# Patient Record
Sex: Male | Born: 1995 | Race: White | Hispanic: No | Marital: Single | Smoking: Current some day smoker
Health system: Southern US, Community
[De-identification: ages and names within clinical notes are randomized; demographics above are authoritative.]

## PROBLEM LIST (undated history)

## (undated) DIAGNOSIS — F909 Attention-deficit hyperactivity disorder, unspecified type: Secondary | ICD-10-CM

## (undated) DIAGNOSIS — IMO0002 Reserved for concepts with insufficient information to code with codable children: Principal | ICD-10-CM

## (undated) HISTORY — DX: Reserved for concepts with insufficient information to code with codable children: IMO0002

---
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
## 2016-01-03 ENCOUNTER — Ambulatory Visit (HOSPITAL_COMMUNITY): Payer: Self-pay | Admitting: Psychiatry

## 2016-01-03 ENCOUNTER — Encounter (HOSPITAL_COMMUNITY): Payer: Self-pay | Admitting: *Deleted

## 2016-02-07 ENCOUNTER — Encounter: Payer: Self-pay | Admitting: Pediatrics

## 2017-03-30 ENCOUNTER — Emergency Department (HOSPITAL_COMMUNITY)
Admission: EM | Admit: 2017-03-30 | Discharge: 2017-03-31 | Disposition: A | Discharge status: Home / Self Care | Payer: Self-pay | Attending: Emergency Medicine | Admitting: Emergency Medicine

## 2017-03-30 ENCOUNTER — Encounter (HOSPITAL_COMMUNITY): Payer: Self-pay | Admitting: Emergency Medicine

## 2017-03-30 DIAGNOSIS — K625 Hemorrhage of anus and rectum: Secondary | ICD-10-CM | POA: Insufficient documentation

## 2017-03-30 DIAGNOSIS — F17200 Nicotine dependence, unspecified, uncomplicated: Secondary | ICD-10-CM | POA: Insufficient documentation

## 2017-03-30 DIAGNOSIS — Z79899 Other long term (current) drug therapy: Secondary | ICD-10-CM | POA: Insufficient documentation

## 2017-03-30 DIAGNOSIS — K6289 Other specified diseases of anus and rectum: Secondary | ICD-10-CM | POA: Insufficient documentation

## 2017-03-30 NOTE — ED Provider Notes (Signed)
[facility]-EMERGENCY DEPT Provider Note   CSN: [id number] Arrival date & time: 03/30/17  2250     History   Chief Complaint Chief Complaint  Patient presents with  . Rectal Bleeding    HPI Jordan Sosa is a 21 y.o. male.  Patient presents to the emergency department for evaluation of rectal pain and rectal bleeding. Symptoms ongoing for approximately a month. Patient reports that he has noticed that he has been experiencing decreased number of bowel movements, now only approximately twice a week which is unusual for him. When he does go, however, he notices bright red or dark red blood mixed with the stool. He has been experiencing increasing rectal pain which is now constant.      Past Medical History:  Diagnosis Date  . ADHD (attention deficit hyperactivity disorder)   . Behavior problems 04/18/2013    Patient Active Problem List   Diagnosis Date Noted  . ODD (oppositional defiant disorder) 10/02/2014  . 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 Polysubstance abuse 10/02/2014  . Attention deficit hyperactivity disorder (ADHD), combined type 08/23/2014  . Acne vulgaris 08/23/2014  . Marijuana use 07/20/2013  . High risk sexual behavior 07/20/2013  . Behavior problems 04/18/2013    History reviewed. No pertinent surgical history.     Home Medications    Prior to Admission medications   Medication Sig Start Date End Date Taking? Authorizing Provider  ciprofloxacin (CIPRO) 500 MG tablet Take 1 tablet (500 mg total) by mouth 2 (two) times daily. 03/31/17   Gilda Crease, MD  hydrocortisone (ANUSOL-HC) 25 MG suppository Place 1 suppository (25 mg total) rectally 2 (two) times daily. For 7 days 03/31/17   Gilda Crease, MD  methylphenidate 54 MG PO CR tablet Take 1 tablet (54 mg total) by mouth every morning. 12/05/15   Myrlene Broker, MD  metroNIDAZOLE (FLAGYL) 500 MG tablet Take 1 tablet (500 mg total) by mouth 3  (three) times daily. 03/31/17   Gilda Crease, MD  traMADol (ULTRAM) 50 MG tablet Take 1 tablet (50 mg total) by mouth every 6 (six) hours as needed. 03/31/17   Gilda Crease, MD    Family History History reviewed. No pertinent family history.  Social History Social History  Substance Use Topics  . Smoking status: Current Some Day Smoker  . Smokeless tobacco: Never Used  . Alcohol use No     Comment: On the Weekends.Marland Kitchen4-26-17 per pt no     Allergies   Patient has no known allergies.   Review of Systems Review of Systems  Gastrointestinal: Positive for anal bleeding, blood in stool and rectal pain.  All other systems reviewed and are negative.    Physical Exam Updated Vital Signs BP 126/68 (BP Location: Right Arm)   Pulse 76   Temp 98.3 F (36.8 C) (Oral)   Resp 15   Ht 6\' 2"  (1.88 m)   Wt 70.3 kg (155 lb)   SpO2 99%   BMI 19.90 kg/m   Physical Exam  Constitutional: He is oriented to person, place, and time. He appears well-developed and well-nourished. No distress.  HENT:  Head: Normocephalic and atraumatic.  Right Ear: Hearing normal.  Left Ear: Hearing normal.  Nose: Nose normal.  Mouth/Throat: Oropharynx is clear and moist and mucous membranes are normal.  Eyes: Pupils are equal, round, and reactive to light. Conjunctivae and EOM are normal.  Neck: Normal range of motion. Neck supple.  Cardiovascular: Regular rhythm, S1  normal and S2 normal.  Exam reveals no gallop and no friction rub.   No murmur heard. Pulmonary/Chest: Effort normal and breath sounds normal. No respiratory distress. He exhibits no tenderness.  Abdominal: Soft. Normal appearance and bowel sounds are normal. There is no hepatosplenomegaly. There is no tenderness. There is no rebound, no guarding, no tenderness at McBurney's point and negative Murphy's sign. No hernia.  Genitourinary: Prostate normal. Rectal exam shows tenderness and guaiac positive stool. Rectal exam shows no  external hemorrhoid, no internal hemorrhoid, no fissure, no mass and anal tone normal.  Musculoskeletal: Normal range of motion.  Neurological: He is alert and oriented to person, place, and time. He has normal strength. No cranial nerve deficit or sensory deficit. Coordination normal. GCS eye subscore is 4. GCS verbal subscore is 5. GCS motor subscore is 6.  Skin: Skin is warm, dry and intact. No rash noted. No cyanosis.  Psychiatric: He has a normal mood and affect. His speech is normal and behavior is normal. Thought content normal.  Nursing note and vitals reviewed.    ED Treatments / Results  Labs (all labs ordered are listed, but only abnormal results are displayed) Labs Reviewed  COMPREHENSIVE METABOLIC PANEL - Abnormal; Notable for the following:       Result Value   ALT 10 (*)    All other components within normal limits  CBC - Abnormal; Notable for the following:    WBC 13.7 (*)    All other components within normal limits  POC OCCULT BLOOD, ED  TYPE AND SCREEN    EKG  EKG Interpretation None       Radiology Ct Abdomen Pelvis W Contrast  Result Date: 03/31/2017 CLINICAL DATA:  Intermittent blood in stools, irregular bowel movements, rectal pain for 2 months. EXAM: CT ABDOMEN AND PELVIS WITH CONTRAST TECHNIQUE: Multidetector CT imaging of the abdomen and pelvis was performed using the standard protocol following bolus administration of intravenous contrast. CONTRAST:  ISOVUE-300 IOPAMIDOL (ISOVUE-300) INJECTION 61% COMPARISON:  CT abdomen and pelvis June 27, 2014 FINDINGS: LOWER CHEST: Lung bases are clear. Included heart size is normal. No pericardial effusion. HEPATOBILIARY: Liver and gallbladder are normal. PANCREAS: Normal. SPLEEN: Normal. ADRENALS/URINARY TRACT: Kidneys are orthotopic, demonstrating symmetric enhancement. No nephrolithiasis, hydronephrosis or solid renal masses. The unopacified ureters are normal in course and caliber. Urinary bladder is  partially distended and unremarkable. Normal adrenal glands. STOMACH/BOWEL: The stomach, small and large bowel are normal in course and caliber without inflammatory changes, sensitivity decreased without oral contrast. Mild amount of retained large bowel stool. Normal appendix. VASCULAR/LYMPHATIC: Aortoiliac vessels are normal in course and caliber. No lymphadenopathy by CT size criteria. REPRODUCTIVE: Normal. OTHER: No intraperitoneal free fluid or free air. MUSCULOSKELETAL: Nonacute. IMPRESSION: Negative contrast-enhanced CT abdomen and pelvis. Electronically Signed   By: Awilda Metro M.D.   On: 03/31/2017 01:18    Procedures Procedures (including critical care time)  Medications Ordered in ED Medications  sodium chloride 0.9 % bolus 1,000 mL (1,000 mLs Intravenous New Bag/Given 03/31/17 0054)  iopamidol (ISOVUE-300) 61 % injection 100 mL (100 mLs Intravenous Contrast Given 03/31/17 0104)     Initial Impression / Assessment and Plan / ED Course  I have reviewed the triage vital signs and the nursing notes.  Pertinent labs & imaging results that were available during my care of the patient were reviewed by me and considered in my medical decision making (see chart for details).     Patient presents to the emergency department  for evaluation of rectal bleeding and rectal pain. Symptoms ongoing for approximately a month. Patient reports passage of bright red blood with bowel movements. Patient did have a leukocytosis on blood work. Abdominal exam was largely benign but he did have significant pain on rectal exam. No obvious fissure, hemorrhoid, abscess noted on exam. CT performed to evaluate for deeper infection and inflammation. No acute abnormality noted on CT. Will treat pain, Anusol HC, empiric antibiotics and follow-up with gastroenterology for further evaluation of rectal pain and rectal bleeding.  Final Clinical Impressions(s) / ED Diagnoses   Final diagnoses:  Rectal bleeding    Rectal pain    New Prescriptions New Prescriptions   CIPROFLOXACIN (CIPRO) 500 MG TABLET    Take 1 tablet (500 mg total) by mouth 2 (two) times daily.   HYDROCORTISONE (ANUSOL-HC) 25 MG SUPPOSITORY    Place 1 suppository (25 mg total) rectally 2 (two) times daily. For 7 days   METRONIDAZOLE (FLAGYL) 500 MG TABLET    Take 1 tablet (500 mg total) by mouth 3 (three) times daily.   TRAMADOL (ULTRAM) 50 MG TABLET    Take 1 tablet (50 mg total) by mouth every 6 (six) hours as needed.     Gilda Crease, MD 03/31/17 0140

## 2017-03-30 NOTE — ED Triage Notes (Signed)
Pt states over last 2 months he has been having blood in his stools. Pt states his bowel movements are not regular. Pt also c/o rectal pain.

## 2017-03-31 ENCOUNTER — Emergency Department (HOSPITAL_COMMUNITY): Payer: Self-pay

## 2017-03-31 LAB — TYPE AND SCREEN
ABO/RH(D): O NEG
ANTIBODY SCREEN: NEGATIVE

## 2017-03-31 LAB — CBC
HCT: 43.4 % (ref 39.0–52.0)
Hemoglobin: 15.1 g/dL (ref 13.0–17.0)
MCH: 31.9 pg (ref 26.0–34.0)
MCHC: 34.8 g/dL (ref 30.0–36.0)
MCV: 91.8 fL (ref 78.0–100.0)
PLATELETS: 229 10*3/uL (ref 150–400)
RBC: 4.73 MIL/uL (ref 4.22–5.81)
RDW: 12.8 % (ref 11.5–15.5)
WBC: 13.7 10*3/uL — ABNORMAL HIGH (ref 4.0–10.5)

## 2017-03-31 LAB — COMPREHENSIVE METABOLIC PANEL
ALK PHOS: 56 U/L (ref 38–126)
ALT: 10 U/L — AB (ref 17–63)
AST: 18 U/L (ref 15–41)
Albumin: 4.5 g/dL (ref 3.5–5.0)
Anion gap: 7 (ref 5–15)
BILIRUBIN TOTAL: 0.8 mg/dL (ref 0.3–1.2)
BUN: 16 mg/dL (ref 6–20)
CALCIUM: 9.2 mg/dL (ref 8.9–10.3)
CO2: 29 mmol/L (ref 22–32)
CREATININE: 1.15 mg/dL (ref 0.61–1.24)
Chloride: 103 mmol/L (ref 101–111)
GFR calc non Af Amer: >60 mL/min (ref >60)
Glucose, Bld: 76 mg/dL (ref 65–99)
Potassium: 3.8 mmol/L (ref 3.5–5.1)
SODIUM: 139 mmol/L (ref 135–145)
TOTAL PROTEIN: 7.6 g/dL (ref 6.5–8.1)

## 2017-03-31 MED ORDER — TRAMADOL HCL 50 MG PO TABS: 50.0000 mg | ORAL_TABLET | Freq: Four times a day (QID) | ORAL | 0 refills | Status: AC | PRN

## 2017-03-31 MED ORDER — CIPROFLOXACIN HCL 500 MG PO TABS: 500.0000 mg | ORAL_TABLET | Freq: Two times a day (BID) | ORAL | 0 refills | Status: AC

## 2017-03-31 MED ORDER — HYDROCORTISONE ACETATE 25 MG RE SUPP: 25.0000 mg | Freq: Two times a day (BID) | RECTAL | 0 refills | Status: AC

## 2017-03-31 MED ORDER — METRONIDAZOLE 500 MG PO TABS: 500.0000 mg | ORAL_TABLET | Freq: Three times a day (TID) | ORAL | 0 refills | Status: AC

## 2017-03-31 MED ORDER — SODIUM CHLORIDE 0.9 % IV BOLUS (SEPSIS)
1000.0000 mL | Freq: Once | INTRAVENOUS | Status: AC
Start: 2017-03-31 — End: 2017-03-31
  Administered 2017-03-31: 1000 mL via INTRAVENOUS

## 2017-03-31 MED ORDER — IOPAMIDOL (ISOVUE-300) INJECTION 61%
100.0000 mL | Freq: Once | INTRAVENOUS | Status: AC | PRN
  Administered 2017-03-31: 100 mL via INTRAVENOUS

## 2018-06-09 ENCOUNTER — Encounter: Payer: Self-pay | Admitting: Pediatrics

## 2018-11-10 DEATH — deceased

## 2019-06-12 IMAGING — CT CT ABD-PELV W/ CM
2 of 4 series · 16 of 46 positions shown, 18 images · IV contrast (iopamidol)
Comparison: CT abdomen and pelvis June 27, 2014

CLINICAL DATA: Intermittent blood in stools, irregular bowel
movements, rectal pain for 2 months.

EXAM:
CT ABDOMEN AND PELVIS WITH CONTRAST
TECHNIQUE: Multidetector CT imaging of the abdomen and pelvis was performed
using the standard protocol following bolus administration of
intravenous contrast.
CONTRAST:  100mL R0OA2D-W88 IOPAMIDOL (R0OA2D-W88) INJECTION 61%

[Series 2: axial st · axial · 0.71mm/px · z∈[+914,+1364]mm · 13 of 102 slices shown, 15 images]
[im 6/102  soft-tissue]
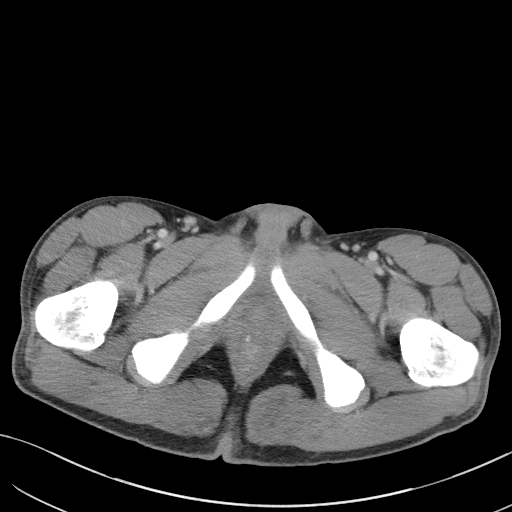
[im 6/102  bone]
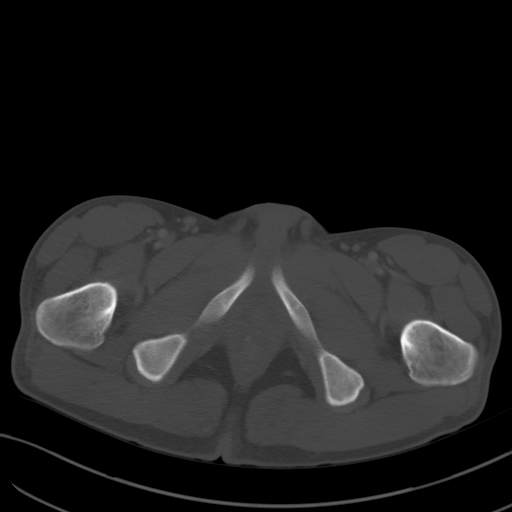
[im 16/102  soft-tissue]
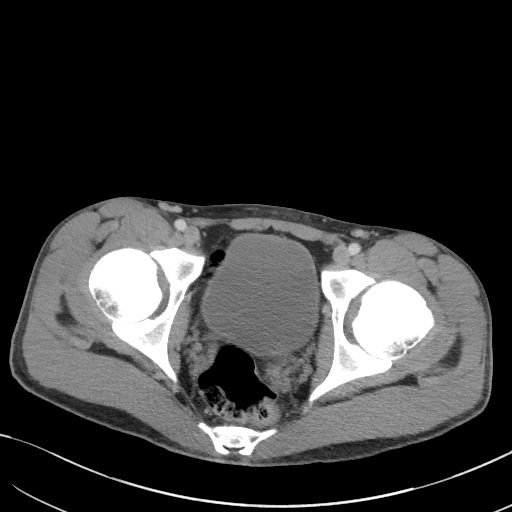
[im 21/102  soft-tissue]
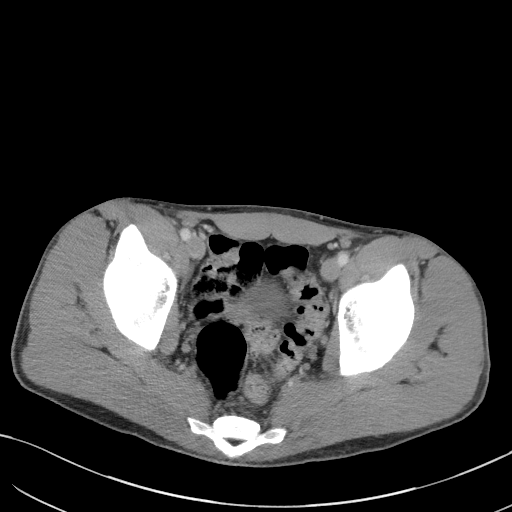
[im 31/102  soft-tissue]
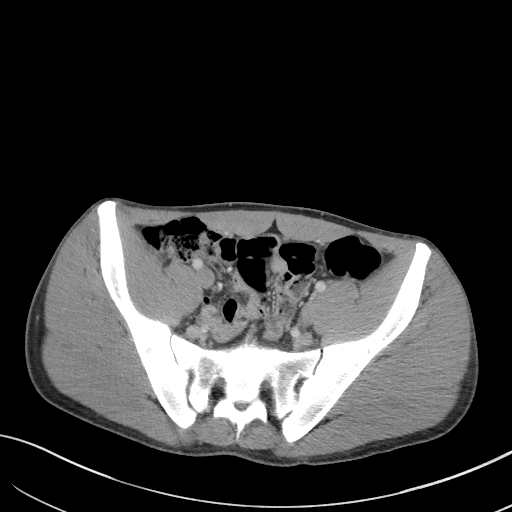
[im 36/102  soft-tissue]
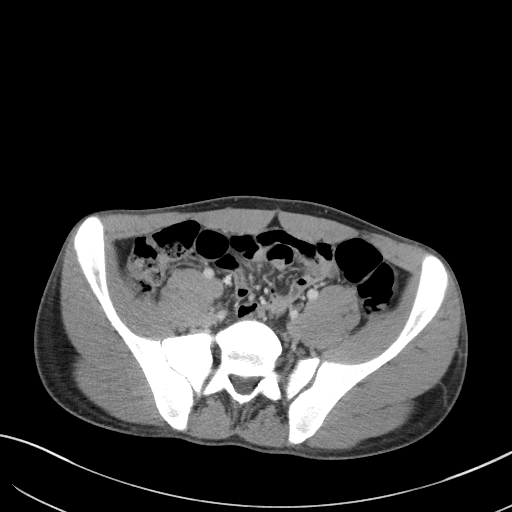
[im 46/102  soft-tissue]
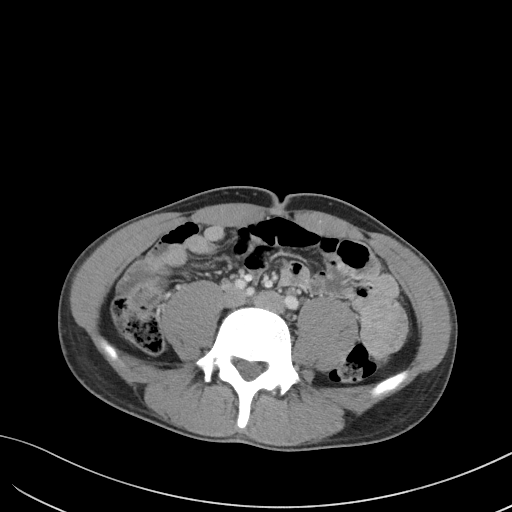
[im 51/102  soft-tissue]
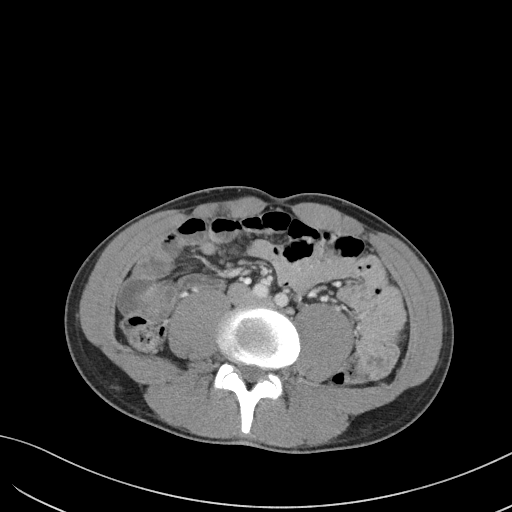
[im 56/102  soft-tissue]
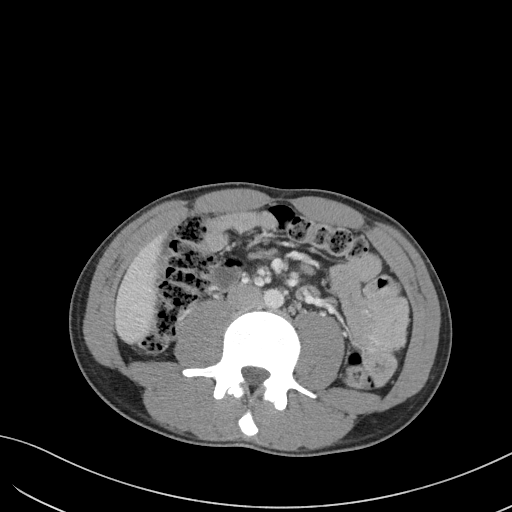
[im 66/102  soft-tissue]
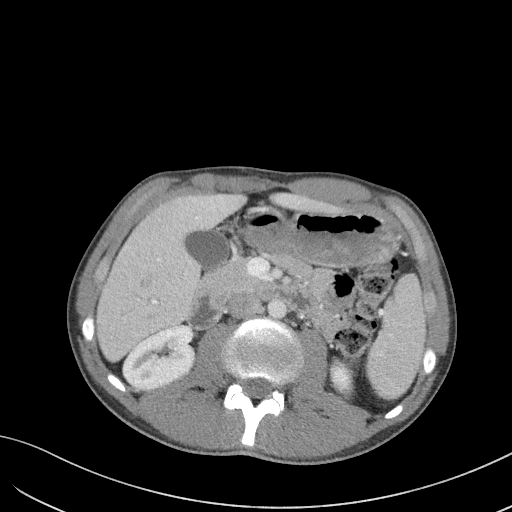
[im 66/102  bone]
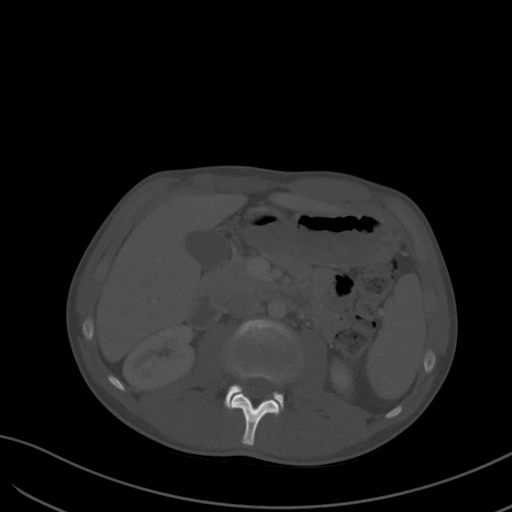
[im 71/102  soft-tissue]
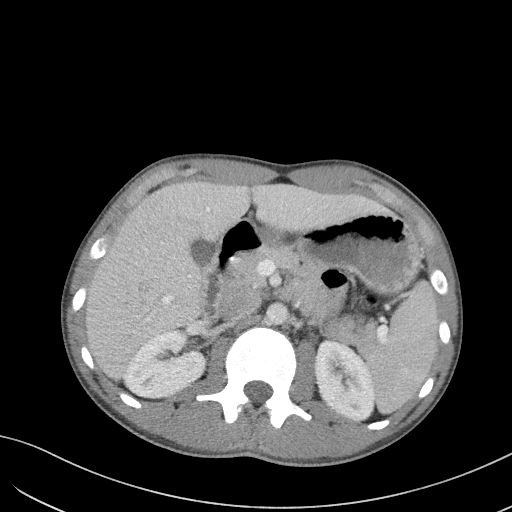
[im 81/102  soft-tissue]
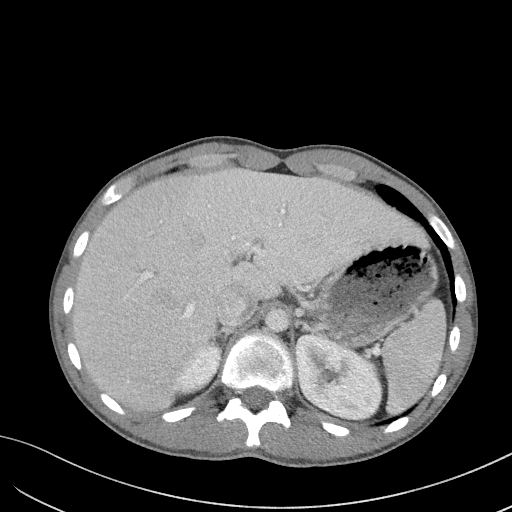
[im 86/102  soft-tissue]
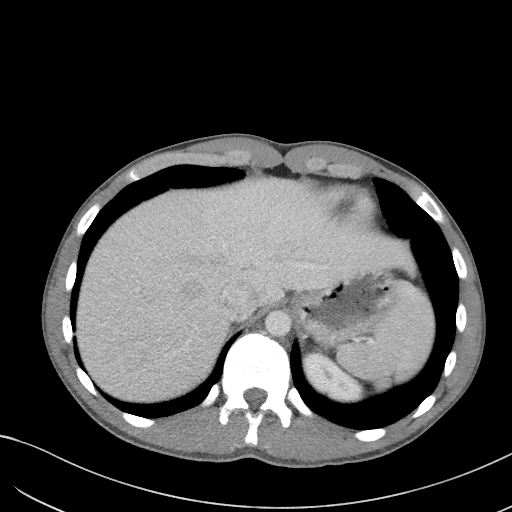
[im 96/102  soft-tissue]
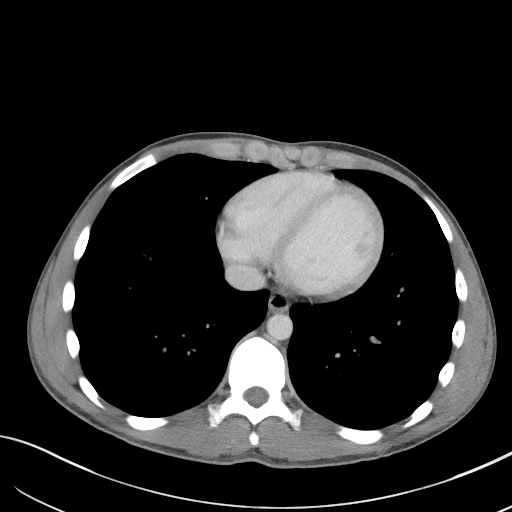

[Series 5: coronal st · coronal · 0.72mm/px · 3 of 76 slices shown]
[im 26/76  soft-tissue]
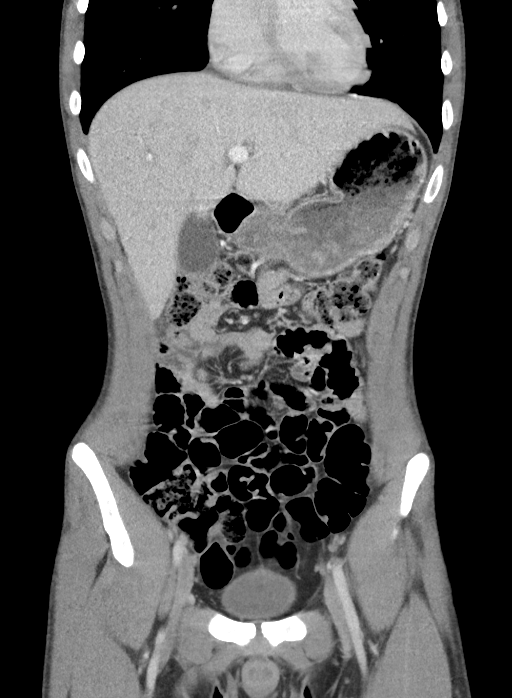
[im 34/76  soft-tissue]
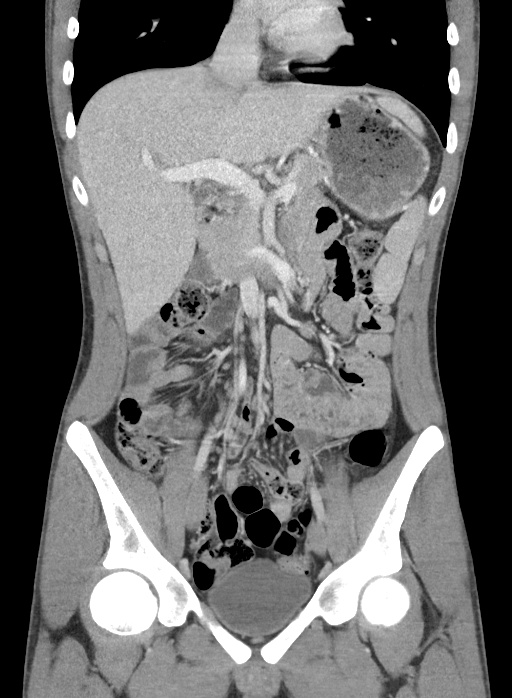
[im 42/76  soft-tissue]
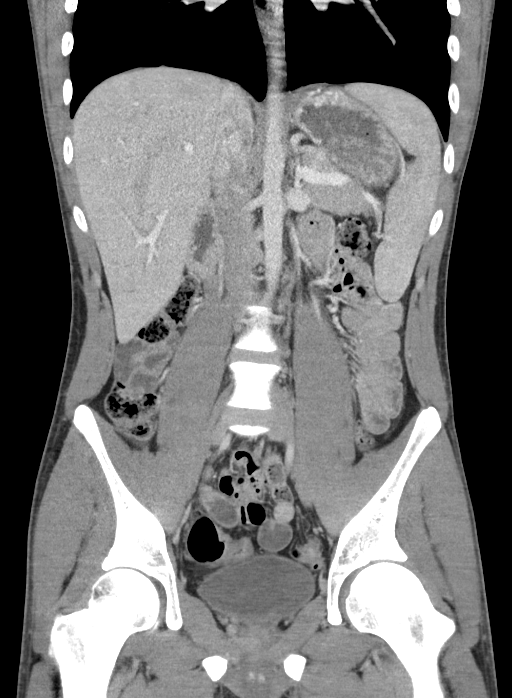

[16 of 46 positions shown; findings below may reference images not displayed]

FINDINGS: LOWER CHEST: Lung bases are clear. Included heart size is normal. No
pericardial effusion.

HEPATOBILIARY: Liver and gallbladder are normal.

PANCREAS: Normal.

SPLEEN: Normal.

ADRENALS/URINARY TRACT: Kidneys are orthotopic, demonstrating
symmetric enhancement. No nephrolithiasis, hydronephrosis or solid
renal masses. The unopacified ureters are normal in course and
caliber. Urinary bladder is partially distended and unremarkable.
Normal adrenal glands.

STOMACH/BOWEL: The stomach, small and large bowel are normal in
course and caliber without inflammatory changes, sensitivity
decreased without oral contrast. Mild amount of retained large bowel
stool. Normal appendix.

VASCULAR/LYMPHATIC: Aortoiliac vessels are normal in course and
caliber. No lymphadenopathy by CT size criteria.

REPRODUCTIVE: Normal.

OTHER: No intraperitoneal free fluid or free air.

MUSCULOSKELETAL: Nonacute.
IMPRESSION: Negative contrast-enhanced CT abdomen and pelvis.
